# Patient Record
Sex: Male | Born: 2005 | Race: Black or African American | Hispanic: No | Marital: Single | Smoking: Never smoker
Health system: Southern US, Community
[De-identification: ages and names within clinical notes are randomized; demographics above are authoritative.]

## PROBLEM LIST (undated history)

## (undated) DIAGNOSIS — F840 Autistic disorder: Secondary | ICD-10-CM

## (undated) DIAGNOSIS — F801 Expressive language disorder: Secondary | ICD-10-CM

## (undated) DIAGNOSIS — R620 Delayed milestone in childhood: Secondary | ICD-10-CM

## (undated) DIAGNOSIS — R404 Transient alteration of awareness: Secondary | ICD-10-CM

## (undated) DIAGNOSIS — Q928 Other specified trisomies and partial trisomies of autosomes: Secondary | ICD-10-CM

## (undated) DIAGNOSIS — K219 Gastro-esophageal reflux disease without esophagitis: Secondary | ICD-10-CM

## (undated) DIAGNOSIS — Q043 Other reduction deformities of brain: Secondary | ICD-10-CM

## (undated) DIAGNOSIS — R1115 Cyclical vomiting syndrome unrelated to migraine: Secondary | ICD-10-CM

## (undated) DIAGNOSIS — Q040 Congenital malformations of corpus callosum: Secondary | ICD-10-CM

## (undated) HISTORY — PX: CIRCUMCISION: SUR203

## (undated) HISTORY — PX: OTHER SURGICAL HISTORY: SHX169

## (undated) HISTORY — PX: SPINE SURGERY: SHX786

## (undated) HISTORY — PX: EYE SURGERY: SHX253

## (undated) HISTORY — PX: BRAIN SURGERY: SHX531

---
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
## 2025-03-09 ENCOUNTER — Ambulatory Visit (INDEPENDENT_AMBULATORY_CARE_PROVIDER_SITE_OTHER): Payer: Self-pay | Admitting: Family
